# Patient Record
Sex: Male | Born: 2000
Health system: Southern US, Community
[De-identification: ages and names within clinical notes are randomized; demographics above are authoritative.]

---
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
## 2021-02-13 ENCOUNTER — Other Ambulatory Visit: Payer: Self-pay

## 2021-02-13 ENCOUNTER — Encounter: Payer: Self-pay | Admitting: Adult Health

## 2021-02-13 ENCOUNTER — Ambulatory Visit (INDEPENDENT_AMBULATORY_CARE_PROVIDER_SITE_OTHER): Payer: 59 | Admitting: Adult Health

## 2021-02-13 DIAGNOSIS — F321 Major depressive disorder, single episode, moderate: Secondary | ICD-10-CM | POA: Diagnosis not present

## 2021-02-13 DIAGNOSIS — Z87898 Personal history of other specified conditions: Secondary | ICD-10-CM | POA: Diagnosis not present

## 2021-02-13 DIAGNOSIS — G4700 Insomnia, unspecified: Secondary | ICD-10-CM | POA: Diagnosis not present

## 2021-02-13 DIAGNOSIS — F22 Delusional disorders: Secondary | ICD-10-CM

## 2021-02-13 DIAGNOSIS — F4010 Social phobia, unspecified: Secondary | ICD-10-CM | POA: Diagnosis not present

## 2021-02-13 DIAGNOSIS — F1291 Cannabis use, unspecified, in remission: Secondary | ICD-10-CM

## 2021-02-13 MED ORDER — BUSPIRONE HCL 10 MG PO TABS: ORAL_TABLET | ORAL | 2 refills | Status: AC

## 2021-02-13 MED ORDER — RISPERIDONE 1 MG PO TABS: ORAL_TABLET | ORAL | 2 refills | Status: AC

## 2021-02-13 NOTE — Progress Notes (Signed)
Press Casale [id number] Aug 24, 2000 20 y.o.  Subjective:   Patient ID:  Justin Hopkins is a 20 y.o. (DOB 2001-01-06) male.  Chief Complaint: No chief complaint on file.   HPI Jeancarlos Michel presents to the office today for follow-up of paranoia, MDD, GAD, SAD. insomnia, panic attacks.  Describes mood today as "ok". Pleasant. Mood symptoms - reports depression, anxiety, and irritability. Stating "I've been trying to manage things on my own". Has stopped taking medications - "I haven't taken anything in a while now". Feels like he's had time to see how he does with and without the medications and feels he does better on them. Varying interest and motivation. Taking medications as prescribed.  Energy levels stable. Active, has a regular exercise routine. Running.  Enjoys some usual interests and activities. Living at home. Taking classes at [employer]. Plans to return to [employer] next semester. Has an older brother. Spending time with family and friends. Appetite adequate. Weight stable.  Sleeps well most nights. Averages 7 to 8 hours.  Focus and concentration difficulties "here and there". Completing tasks. Managing aspects of household. 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  Denies SI or HI.  Denies AH or VH.  Denies paranoia. Denies recent substance use. Consumes alcohol  Previous medication trials: Denies  Review of Systems:  Review of Systems  Musculoskeletal:  Negative for gait problem.  Neurological:  Negative for tremors.  Psychiatric/Behavioral:         Please refer to HPI   Medications: I have reviewed the patient's current medications.  Current Outpatient Medications  Medication Sig Dispense Refill   busPIRone (BUSPAR) 10 MG tablet Take one-half tablet twice daily x 7 days, then increase to one tablet twice daily. 60 tablet 2   clobetasol cream (TEMOVATE) 0.05 % APPLY TOPICALLY TO THE AFFECTED AREA EVERY DAY FOR RASH     risperiDONE (RISPERDAL) 1 MG tablet  Take one-half tablet at bedtime for 7 days, then increase to one tablet at bedtime. 30 tablet 2   No current facility-administered medications for this visit.    Medication Side Effects: None  Allergies: No Known Allergies  No past medical history on file.  Past Medical History, Surgical history, Social history, and Family history were reviewed and updated as appropriate.   Please see review of systems for further details on the patient's review from today.   Objective:   Physical Exam:  There were no vitals taken for this visit.  Physical Exam Constitutional:      General: He is not in acute distress. Musculoskeletal:        General: No deformity.  Neurological:     Mental Status: He is alert and oriented to person, place, and time.     Coordination: Coordination normal.  Psychiatric:        Attention and Perception: Attention and perception normal. He does not perceive auditory or visual hallucinations.        Mood and Affect: Mood normal. Mood is not anxious or depressed. Affect is not labile, blunt, angry or inappropriate.        Speech: Speech normal.        Behavior: Behavior normal.        Thought Content: Thought content normal. Thought content is not paranoid or delusional. Thought content does not include homicidal or suicidal ideation. Thought content does not include homicidal or suicidal plan.        Cognition and Memory: Cognition and memory normal.  Judgment: Judgment normal.     Comments: Insight intact    Lab Review:     Component Value Date/Time   NA 139 05/28/2019 0548   K 3.8 05/28/2019 0548   CL 103 05/28/2019 0548   CO2 21 (L) 05/28/2019 0548   GLUCOSE 89 05/28/2019 0548   BUN 7 05/28/2019 0548   CREATININE 0.65 05/28/2019 0548   CALCIUM 9.2 05/28/2019 0548   GFRNONAA >60 05/28/2019 0548   GFRAA >60 05/28/2019 0548       Component Value Date/Time   WBC 8.1 05/28/2019 0548   RBC 4.96 05/28/2019 0548   HGB 15.1 05/28/2019 0548   HCT  45.3 05/28/2019 0548   PLT 301 05/28/2019 0548   MCV 91.3 05/28/2019 0548   MCH 30.4 05/28/2019 0548   MCHC 33.3 05/28/2019 0548   RDW 11.8 05/28/2019 0548    No results found for: POCLITH, LITHIUM   No results found for: PHENYTOIN, PHENOBARB, VALPROATE, CBMZ   .res Assessment: Plan:    Plan:  1. Restart Risperdal 1mg  at bedtime - stopped several months ago 2. Restart Buspar 10mg  BID for anxiety - stopped several months ago  Seeing therapist  PDMP reviewed  RTC 4 weeks  Patient advised to contact office with any questions, adverse effects, or acute worsening in signs and symptoms.  Discussed potential metabolic side effects associated with atypical antipsychotics, as well as potential risk for movement side effects. Advised pt to contact office if movement side effects occur.    Diagnoses and all orders for this visit:  Major depressive disorder, single episode, moderate with anxious distress (HCC) - r/o mood-congruent psychotic features -     risperiDONE (RISPERDAL) 1 MG tablet; Take one-half tablet at bedtime for 7 days, then increase to one tablet at bedtime.  History of marijuana use with adverse reaction -     risperiDONE (RISPERDAL) 1 MG tablet; Take one-half tablet at bedtime for 7 days, then increase to one tablet at bedtime.  Insomnia, unspecified type -     risperiDONE (RISPERDAL) 1 MG tablet; Take one-half tablet at bedtime for 7 days, then increase to one tablet at bedtime.  Social anxiety disorder -     risperiDONE (RISPERDAL) 1 MG tablet; Take one-half tablet at bedtime for 7 days, then increase to one tablet at bedtime. -     busPIRone (BUSPAR) 10 MG tablet; Take one-half tablet twice daily x 7 days, then increase to one tablet twice daily.  Paranoia (HCC) -     risperiDONE (RISPERDAL) 1 MG tablet; Take one-half tablet at bedtime for 7 days, then increase to one tablet at bedtime.    Please see After Visit Summary for patient specific  instructions.  No future appointments.  No orders of the defined types were placed in this encounter.   -------------------------------

## 2021-03-13 ENCOUNTER — Ambulatory Visit: Payer: 59 | Admitting: Adult Health

## 2021-03-13 NOTE — Progress Notes (Signed)
Patient no show appointment. ? ?

## 2021-11-17 IMAGING — DX DG NASAL BONES 3+V
3 series · 3 of 3 positions shown · non-contrast
Comparison: None.

CLINICAL DATA: 18-year-old who fell down stairs yesterday, striking
his nose on a step. Laceration and swelling to the bridge of the
nose.

EXAM:
NASAL BONES - 3+ VIEW

[nasal lat (1 of 2)]
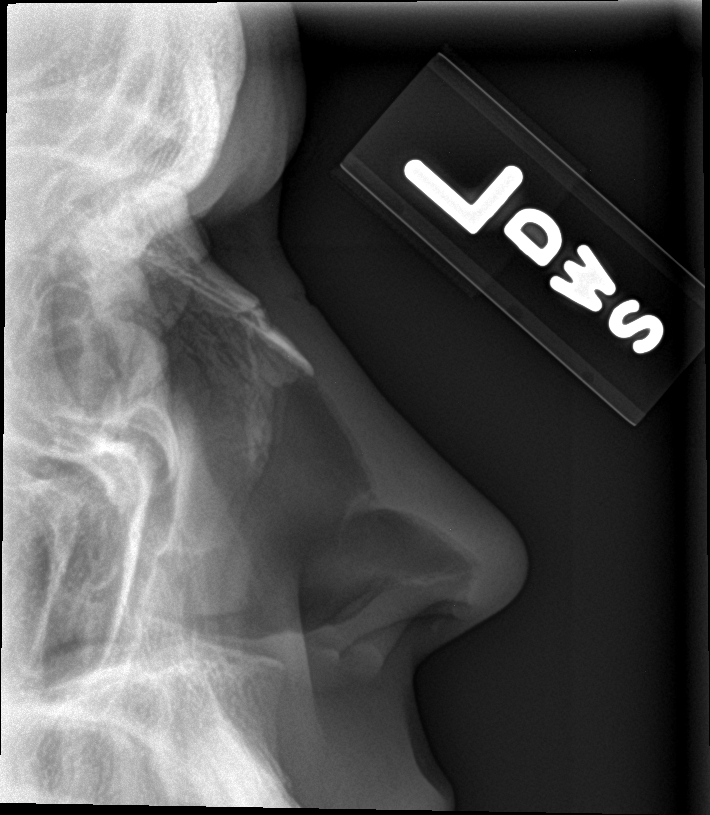

[nasal lat (2 of 2)]
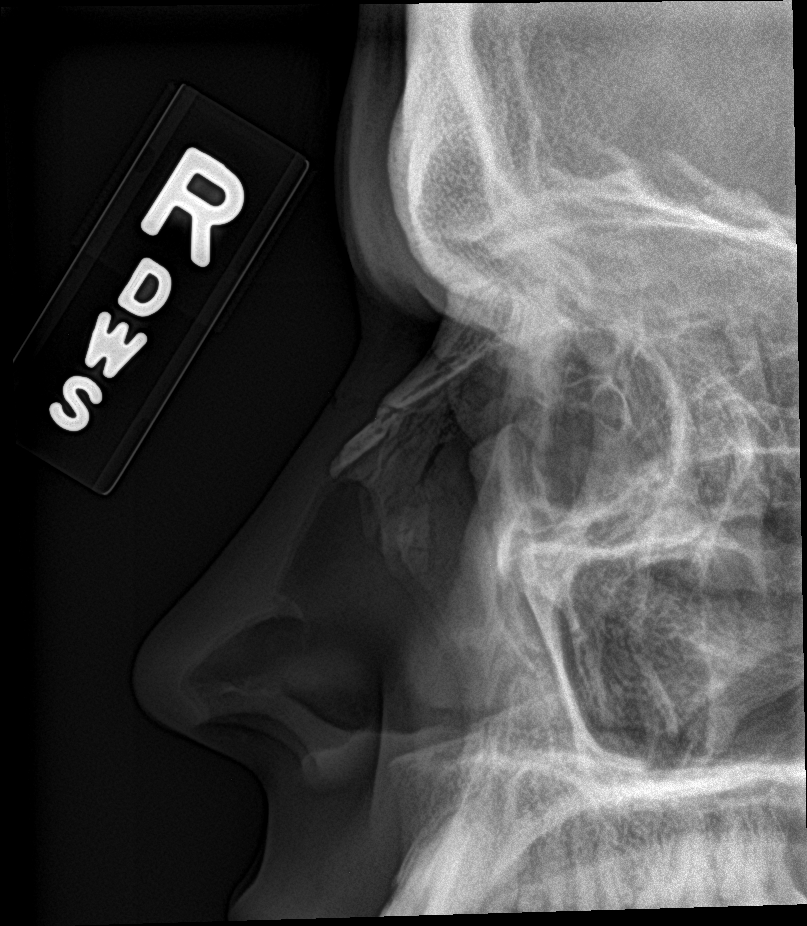

[skull waters]
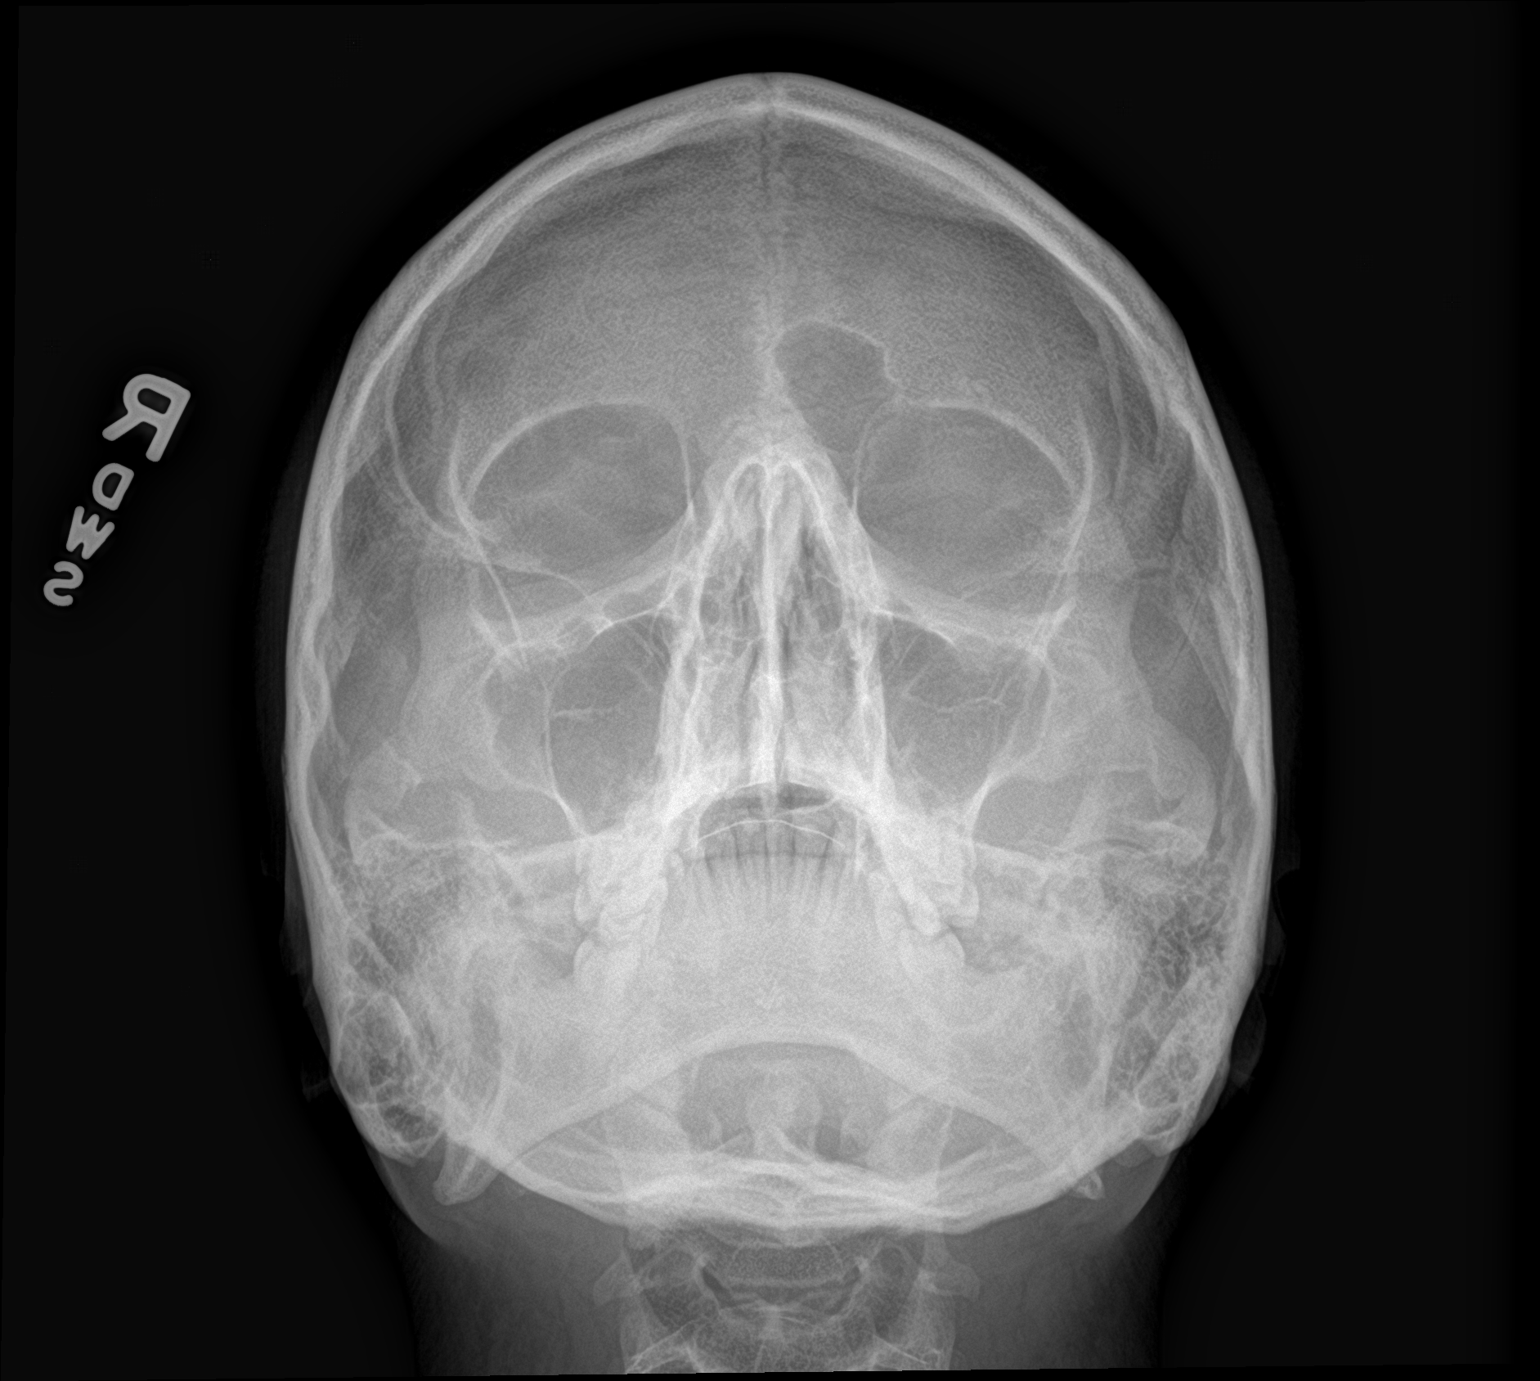

[3 of 3 positions shown; findings below may reference images not displayed]

FINDINGS: Fracture involving the nasal bones with minimal, insignificant
inward displacement. No other fractures identified in the visualized
facial bones. Bony nasal septum midline.
IMPRESSION: Nasal bone fracture with minimal, insignificant inward displacement.

## 2022-07-13 DIAGNOSIS — B360 Pityriasis versicolor: Secondary | ICD-10-CM | POA: Diagnosis not present

## 2022-10-25 DIAGNOSIS — L218 Other seborrheic dermatitis: Secondary | ICD-10-CM | POA: Diagnosis not present

## 2023-05-26 ENCOUNTER — Emergency Department (HOSPITAL_BASED_OUTPATIENT_CLINIC_OR_DEPARTMENT_OTHER)
Admission: EM | Admit: 2023-05-26 | Discharge: 2023-05-26 | Disposition: A | Discharge status: Home / Self Care | Payer: BC Managed Care – PPO | Attending: Emergency Medicine | Admitting: Emergency Medicine

## 2023-05-26 ENCOUNTER — Other Ambulatory Visit: Payer: Self-pay

## 2023-05-26 ENCOUNTER — Encounter (HOSPITAL_BASED_OUTPATIENT_CLINIC_OR_DEPARTMENT_OTHER): Payer: Self-pay

## 2023-05-26 DIAGNOSIS — R42 Dizziness and giddiness: Secondary | ICD-10-CM | POA: Insufficient documentation

## 2023-05-26 DIAGNOSIS — R55 Syncope and collapse: Secondary | ICD-10-CM | POA: Insufficient documentation

## 2023-05-26 DIAGNOSIS — R61 Generalized hyperhidrosis: Secondary | ICD-10-CM | POA: Insufficient documentation

## 2023-05-26 LAB — CBG MONITORING, ED: Glucose-Capillary: 76 mg/dL (ref 70–99)

## 2023-05-26 LAB — COMPREHENSIVE METABOLIC PANEL
ALT: 23 U/L (ref 0–44)
AST: 26 U/L (ref 15–41)
Albumin: 4.3 g/dL (ref 3.5–5.0)
Alkaline Phosphatase: 71 U/L (ref 38–126)
Anion gap: 11 (ref 5–15)
BUN: 11 mg/dL (ref 6–20)
CO2: 24 mmol/L (ref 22–32)
Calcium: 9.1 mg/dL (ref 8.9–10.3)
Chloride: 101 mmol/L (ref 98–111)
Creatinine, Ser: 0.95 mg/dL (ref 0.61–1.24)
GFR, Estimated: >60 mL/min (ref >60)
Glucose, Bld: 86 mg/dL (ref 70–99)
Potassium: 3.7 mmol/L (ref 3.5–5.1)
Sodium: 136 mmol/L (ref 135–145)
Total Bilirubin: 0.9 mg/dL (ref <1.2)
Total Protein: 7.9 g/dL (ref 6.5–8.1)

## 2023-05-26 LAB — CBC
HCT: 46.7 % (ref 39.0–52.0)
Hemoglobin: 15.8 g/dL (ref 13.0–17.0)
MCH: 29.7 pg (ref 26.0–34.0)
MCHC: 33.8 g/dL (ref 30.0–36.0)
MCV: 87.8 fL (ref 80.0–100.0)
Platelets: 253 10*3/uL (ref 150–400)
RBC: 5.32 MIL/uL (ref 4.22–5.81)
RDW: 12.8 % (ref 11.5–15.5)
WBC: 8.4 10*3/uL (ref 4.0–10.5)
nRBC: 0 % (ref 0.0–0.2)

## 2023-05-26 NOTE — Discharge Instructions (Addendum)
As discussed, suspect symptoms are likely secondary to vasovagal syncope.  See information attached regarding this diagnosis.  Typically happens during straining or using force such as bearing down to have a bowel movement, lifting weight and to stimulate the vagal nerve which causes drop in blood pressure, heart rate and can cause passing out episodes because the pain does not get adequate blood supply temporarily.  Believe this to be the case with you especially given concern for dehydration.  Recommend continued adequate hydration via electrolyte rich fluids such as Pedialyte, sugar-free Gatorade, liquid IV, Body Armor, Gatorade light.  Your lab testing today was normal with no significant electrolyte abnormalities.  The precautions whenever performing any activity where you could be straining or bearing down more over the next couple of days until you are adequately hydrated.  Please do not hesitate to return if the worrisome signs and symptoms we discussed become apparent.

## 2023-05-26 NOTE — ED Triage Notes (Addendum)
Was having a BM and passed out, started having palpitations and became diaphoretic. Denies feeling sick. C/o lightheadedness. States drank a lot of alcohol last night and was hungover this morning.

## 2023-05-26 NOTE — ED Notes (Signed)
Cbg 76 ?

## 2023-05-26 NOTE — ED Provider Notes (Signed)
[facility] Provider Note   CSN: [id number] Arrival date & time: 05/26/23  1628     History  Chief Complaint  Patient presents with   Loss of Consciousness    Justin Hopkins is a 22 y.o. male.   Loss of Consciousness   22 year old male presents emergency department with complaints of syncopal episode.  Patient states that he was sitting on the toilet having a bowel movement.  When he strained, felt flushed, lightheaded, diaphoretic and subsequently passed out.  States that he remained sitting on the toilet when episode occurred.  States he lost consciousness for around 20 seconds or so before regaining consciousness.  States that he is felt at baseline since then.  No bowel/bladder incontinence, tongue biting, prolonged phase afterwards of confusion/disorientation.  Denies any trauma from incident.  Denies history of similar symptoms in the past.  Does states that he drank "a lot of alcohol" last night and felt as if you are hung over this morning.  Only had a bottle or 2 of water with no other liquids today.  States he feels dehydrated.  Denies any chest pain, shortness of breath, nausea, vomiting, change in bowel habits.  Denies any family history of sudden cardiac death/arrhythmia at a young age.  Past medical history significant for major depressive disorder, GAD, SAT, paranoia  Home Medications Prior to Admission medications   Medication Sig Start Date End Date Taking? Authorizing Provider  busPIRone (BUSPAR) 10 MG tablet Take one-half tablet twice daily x 7 days, then increase to one tablet twice daily. 02/13/21   Mozingo, Thereasa Solo, NP  clobetasol cream (TEMOVATE) 0.05 % APPLY TOPICALLY TO THE AFFECTED AREA EVERY DAY FOR RASH 10/14/19   [provider]  risperiDONE (RISPERDAL) 1 MG tablet Take one-half tablet at bedtime for 7 days, then increase to one tablet at bedtime. 02/13/21   Mozingo, Thereasa Solo, NP       Allergies    Patient has no known allergies.    Review of Systems   Review of Systems  Cardiovascular:  Positive for syncope.  All other systems reviewed and are negative.   Physical Exam Updated Vital Signs BP 138/83 (BP Location: Left Arm)   Pulse 85   Temp 97.9 F (36.6 C) (Oral)   Resp 16   Ht 6' (1.829 m)   Wt 79.4 kg   SpO2 98%   BMI 23.73 kg/m  Physical Exam Vitals and nursing note reviewed.  Constitutional:      General: He is not in acute distress.    Appearance: He is well-developed.  HENT:     Head: Normocephalic and atraumatic.  Eyes:     Conjunctiva/sclera: Conjunctivae normal.  Cardiovascular:     Rate and Rhythm: Normal rate and regular rhythm.     Heart sounds: No murmur heard. Pulmonary:     Effort: Pulmonary effort is normal. No respiratory distress.     Breath sounds: Normal breath sounds. No wheezing, rhonchi or rales.  Abdominal:     Palpations: Abdomen is soft.     Tenderness: There is no abdominal tenderness. There is no guarding.  Musculoskeletal:        General: No swelling.     Cervical back: Neck supple.  Skin:    General: Skin is warm and dry.     Capillary Refill: Capillary refill takes less than 2 seconds.  Neurological:     Mental Status: He is alert.     Comments:  Alert and oriented to self, place, time and event.   Speech is fluent, clear without dysarthria or dysphasia.   Strength 5/5 in upper/lower extremities   Sensation intact in upper/lower extremities   Normal gait.  Negative Romberg. No pronator drift.  Normal finger-to-nose and feet tapping.  CN I not tested  CN II grossly intact visual fields bilaterally. Did not visualize posterior eye.  CN III, IV, VI PERRLA and EOMs intact bilaterally  CN V Intact sensation to sharp and light touch to the face  CN VII facial movements symmetric  CN VIII not tested  CN IX, X no uvula deviation, symmetric rise of soft palate  CN XI 5/5 SCM and trapezius strength bilaterally   CN XII Midline tongue protrusion, symmetric L/R movements   Psychiatric:        Mood and Affect: Mood normal.     ED Results / Procedures / Treatments   Labs (all labs ordered are listed, but only abnormal results are displayed) Labs Reviewed  COMPREHENSIVE METABOLIC PANEL  CBC  CBG MONITORING, ED    EKG None  Radiology No results found.  Procedures Procedures    Medications Ordered in ED Medications - No data to display  ED Course/ Medical Decision Making/ A&P                                 Medical Decision Making Amount and/or Complexity of Data Reviewed Labs: ordered.   This patient presents to the ED for concern of loss of consciousness, this involves an extensive number of treatment options, and is a complaint that carries with it a high risk of complications and morbidity.  The differential diagnosis includes vasovagal syncope, orthostatic hypotension, anemia, arrhythmia, valvular disorder, structural cardiac abnormality, seizure, medication side effect, other   Co morbidities that complicate the patient evaluation  See HPI   Additional history obtained:  Additional history obtained from EMR External records from outside source obtained and reviewed including hospital records   Lab Tests:  I Ordered, and personally interpreted labs.  The pertinent results include: CBG within normal limits.  No leukocytosis.  No evidence of anemia.  Platelets within range.   Imaging Studies ordered:  N/a   Cardiac Monitoring: / EKG:  The patient was maintained on a cardiac monitor.  I personally viewed and interpreted the cardiac monitored which showed an underlying rhythm of: Sinus rhythm   Consultations Obtained:  N/a   Problem List / ED Course / Critical interventions / Medication management  Syncope Reevaluation of the patient showed that the patient stayed the same I have reviewed the patients home medicines and have made adjustments as  needed   Social Determinants of Health:  Binge alcohol use.  Denies illicit substance use.   Test / Admission - Considered:  Syncope Vitals signs within normal range and stable throughout visit. Laboratory studies significant for: See above 22 year old male presents emergency department with complaints of episode of losing consciousness.  Episode occurred when he was straining having a bowel movement.  No postictal phase, bowel/bladder incontinence, tongue biting, tonic/clonic movements; low suspicion for seizure.  Patient without exertional worsening of symptoms, chest pain, shortness of breath, family history of sudden cardiac death at young age; low suspicion for ACS, PE, structural heart disease, arrhythmia.  Patient symptoms most likely secondary to vasovagal syncope in the setting of dehydration from increased alcohol consumption yesterday and inadequate oral hydration today.  Labs  reassuring.  EKG unremarkable.  Patient cardiac monitoring for 90+ minutes in the ED without obvious arrhythmia.  Will recommend adequate oral hydration, abstinence from alcohol as well as precaution whenever bearing down/performing strenuous activity.  Follow-up recommended with PCP for reevaluation.  Treatment plan discussed with him with patient and he acknowledged understanding was agreeable to said plan.  Patient overall well-appearing, afebrile in no acute distress. Worrisome signs and symptoms were discussed with the patient, and the patient acknowledged understanding to return to the ED if noticed. Patient was stable upon discharge.          Final Clinical Impression(s) / ED Diagnoses Final diagnoses:  Vasovagal syncope    Rx / DC Orders ED Discharge Orders     None         Peter Garter, [place] 05/26/23 1753    Alvira Monday, MD 05/28/23 1115

## 2023-07-31 DIAGNOSIS — N50812 Left testicular pain: Secondary | ICD-10-CM | POA: Diagnosis not present

## 2023-07-31 DIAGNOSIS — N503 Cyst of epididymis: Secondary | ICD-10-CM | POA: Diagnosis not present

## 2023-07-31 DIAGNOSIS — N492 Inflammatory disorders of scrotum: Secondary | ICD-10-CM | POA: Diagnosis not present

## 2023-08-05 DIAGNOSIS — R197 Diarrhea, unspecified: Secondary | ICD-10-CM | POA: Diagnosis not present

## 2023-08-05 DIAGNOSIS — Z1339 Encounter for screening examination for other mental health and behavioral disorders: Secondary | ICD-10-CM | POA: Diagnosis not present

## 2023-08-05 DIAGNOSIS — Z1331 Encounter for screening for depression: Secondary | ICD-10-CM | POA: Diagnosis not present

## 2023-08-05 DIAGNOSIS — Z0000 Encounter for general adult medical examination without abnormal findings: Secondary | ICD-10-CM | POA: Diagnosis not present

## 2023-08-07 DIAGNOSIS — N451 Epididymitis: Secondary | ICD-10-CM | POA: Diagnosis not present

## 2024-04-27 DIAGNOSIS — N342 Other urethritis: Secondary | ICD-10-CM | POA: Diagnosis not present

## 2024-04-27 DIAGNOSIS — R300 Dysuria: Secondary | ICD-10-CM | POA: Diagnosis not present

## 2024-04-27 DIAGNOSIS — R070 Pain in throat: Secondary | ICD-10-CM | POA: Diagnosis not present
